# Patient Record
Sex: Male | Born: 1990 | Race: Black or African American | Hispanic: No | Marital: Married | Smoking: Never smoker
Health system: Southern US, Community
[De-identification: ages and names within clinical notes are randomized; demographics above are authoritative.]

---

## 2012-07-02 ENCOUNTER — Other Ambulatory Visit: Payer: Self-pay | Admitting: Family Medicine

## 2012-07-02 ENCOUNTER — Ambulatory Visit
Admission: RE | Admit: 2012-07-02 | Discharge: 2012-07-02 | Disposition: A | Discharge status: Home / Self Care | Payer: Worker's Compensation | Source: Ambulatory Visit | Attending: Family Medicine | Admitting: Family Medicine

## 2012-07-02 DIAGNOSIS — M79604 Pain in right leg: Secondary | ICD-10-CM

## 2013-04-27 ENCOUNTER — Emergency Department (HOSPITAL_COMMUNITY)
Admission: EM | Admit: 2013-04-27 | Discharge: 2013-04-28 | Disposition: A | Discharge status: Home / Self Care | Payer: No Typology Code available for payment source | Attending: Emergency Medicine | Admitting: Emergency Medicine

## 2013-04-27 DIAGNOSIS — S99929A Unspecified injury of unspecified foot, initial encounter: Secondary | ICD-10-CM | POA: Insufficient documentation

## 2013-04-27 DIAGNOSIS — Y92410 Unspecified street and highway as the place of occurrence of the external cause: Secondary | ICD-10-CM | POA: Insufficient documentation

## 2013-04-27 DIAGNOSIS — Y9389 Activity, other specified: Secondary | ICD-10-CM | POA: Insufficient documentation

## 2013-04-27 DIAGNOSIS — S8990XA Unspecified injury of unspecified lower leg, initial encounter: Secondary | ICD-10-CM | POA: Insufficient documentation

## 2013-04-27 DIAGNOSIS — M545 Low back pain: Secondary | ICD-10-CM

## 2013-04-27 DIAGNOSIS — IMO0002 Reserved for concepts with insufficient information to code with codable children: Secondary | ICD-10-CM | POA: Insufficient documentation

## 2013-04-28 ENCOUNTER — Encounter (HOSPITAL_COMMUNITY): Payer: Self-pay

## 2013-04-28 MED ORDER — CYCLOBENZAPRINE HCL 10 MG PO TABS
5.0000 mg | ORAL_TABLET | Freq: Once | ORAL | Status: AC
  Administered 2013-04-28: 5 mg via ORAL
  Filled 2013-04-28: qty 1

## 2013-04-28 MED ORDER — KETOROLAC TROMETHAMINE 30 MG/ML IJ SOLN
30.0000 mg | Freq: Once | INTRAMUSCULAR | Status: AC
  Administered 2013-04-28: 30 mg via INTRAMUSCULAR
  Filled 2013-04-28: qty 1

## 2013-04-28 MED ORDER — IBUPROFEN 600 MG PO TABS: 600.0000 mg | ORAL_TABLET | Freq: Four times a day (QID) | ORAL | Status: AC | PRN

## 2013-04-28 MED ORDER — CYCLOBENZAPRINE HCL 5 MG PO TABS: 5.0000 mg | ORAL_TABLET | Freq: Three times a day (TID) | ORAL | Status: AC | PRN
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
## 2013-04-28 NOTE — ED Provider Notes (Signed)
Medical screening examination/treatment/procedure(s) were performed by non-physician practitioner and as supervising physician I was immediately available for consultation/collaboration.  Orville Widmann M Lakashia Collison, MD 04/28/13 0447 

## 2016-11-04 ENCOUNTER — Emergency Department (HOSPITAL_COMMUNITY)
Admission: EM | Admit: 2016-11-04 | Discharge: 2016-11-04 | Disposition: A | Discharge status: Home / Self Care | Payer: Managed Care, Other (non HMO) | Attending: Emergency Medicine | Admitting: Emergency Medicine

## 2016-11-04 ENCOUNTER — Encounter (HOSPITAL_COMMUNITY): Payer: Self-pay

## 2016-11-04 ENCOUNTER — Emergency Department (HOSPITAL_COMMUNITY): Payer: Managed Care, Other (non HMO)

## 2016-11-04 DIAGNOSIS — Y929 Unspecified place or not applicable: Secondary | ICD-10-CM | POA: Insufficient documentation

## 2016-11-04 DIAGNOSIS — S0181XA Laceration without foreign body of other part of head, initial encounter: Secondary | ICD-10-CM | POA: Insufficient documentation

## 2016-11-04 DIAGNOSIS — W228XXA Striking against or struck by other objects, initial encounter: Secondary | ICD-10-CM | POA: Insufficient documentation

## 2016-11-04 DIAGNOSIS — S0990XA Unspecified injury of head, initial encounter: Secondary | ICD-10-CM | POA: Diagnosis not present

## 2016-11-04 DIAGNOSIS — Y939 Activity, unspecified: Secondary | ICD-10-CM | POA: Insufficient documentation

## 2016-11-04 DIAGNOSIS — Z79899 Other long term (current) drug therapy: Secondary | ICD-10-CM | POA: Diagnosis not present

## 2016-11-04 DIAGNOSIS — S0992XA Unspecified injury of nose, initial encounter: Secondary | ICD-10-CM | POA: Diagnosis present

## 2016-11-04 DIAGNOSIS — S022XXA Fracture of nasal bones, initial encounter for closed fracture: Secondary | ICD-10-CM | POA: Diagnosis not present

## 2016-11-04 DIAGNOSIS — Y999 Unspecified external cause status: Secondary | ICD-10-CM | POA: Insufficient documentation

## 2016-11-04 MED ORDER — OXYCODONE-ACETAMINOPHEN 5-325 MG PO TABS
1.0000 | ORAL_TABLET | Freq: Once | ORAL | Status: AC
  Administered 2016-11-04: 1 via ORAL
  Filled 2016-11-04: qty 1

## 2016-11-04 MED ORDER — LIDOCAINE-EPINEPHRINE (PF) 2 %-1:200000 IJ SOLN
10.0000 mL | Freq: Once | INTRAMUSCULAR | Status: AC
  Administered 2016-11-04: 10 mL
  Filled 2016-11-04: qty 20

## 2016-11-04 MED ORDER — CEPHALEXIN 250 MG PO CAPS
500.0000 mg | ORAL_CAPSULE | Freq: Once | ORAL | Status: AC
  Administered 2016-11-04: 500 mg via ORAL
  Filled 2016-11-04: qty 2

## 2016-11-04 MED ORDER — CEPHALEXIN 500 MG PO CAPS: 500.0000 mg | ORAL_CAPSULE | Freq: Two times a day (BID) | ORAL | 0 refills | Status: DC

## 2016-11-04 MED ORDER — ACETAMINOPHEN 500 MG PO TABS
1000.0000 mg | ORAL_TABLET | Freq: Once | ORAL | Status: AC
  Administered 2016-11-04: 1000 mg via ORAL
  Filled 2016-11-04: qty 2

## 2016-11-04 MED ORDER — LIDOCAINE-EPINEPHRINE-TETRACAINE (LET) SOLUTION
3.0000 mL | Freq: Once | NASAL | Status: AC
  Administered 2016-11-04: 16:00:00 3 mL via TOPICAL
  Filled 2016-11-04: qty 3

## 2016-11-04 MED ORDER — CEPHALEXIN 500 MG PO CAPS: 500.0000 mg | ORAL_CAPSULE | Freq: Three times a day (TID) | ORAL | 0 refills | Status: AC

## 2016-11-04 NOTE — Discharge Instructions (Signed)
Mr. Billy Black   Please take start taking your antibiotic, Keflex twice a day for 4 days [facility] to follow up your nasal fracture You will need to remove your sutures in 7 days.  You can return to the ED, an urgent care or ENT follow up visit to do so

## 2016-11-04 NOTE — ED Notes (Signed)
Pt verbalized understanding discharge instructions and denies any further needs or questions at this time. VS stable, ambulatory and steady gait.   

## 2016-11-04 NOTE — ED Notes (Signed)
[facility] [place] working [facility].  Tech services called.  This RN checked pt's armband and asked pt to repeat bday and name before applying LET.

## 2016-11-04 NOTE — ED Triage Notes (Signed)
Pt was at the trampoline park, did a flip into the foam pit and hit face into a wood 2x2 railing inside the foam pit.  Lac to forehead, across bridge of nose.  Abrasion to right outer eyebrow.  Inside upper lip blood noted.  Pt thinks he lost consciousness for few seconds. Bleeding controlled.

## 2016-11-04 NOTE — ED Provider Notes (Addendum)
[facility]PT Provider Note   CSN: [id number] Arrival date & time: 11/04/16  1241     History   Chief Complaint Chief Complaint  Patient presents with  . Loss of Consciousness  . Laceration    HPI Billy Black is a 26 y.o. male. That presents to the ED after hitting his head and nose on a wooden bar.  The patient was at an indoor foam pit activity area when he did a flip off of a trampoline and traveled for over 30 feet before landing at the edge of the foam pit where he hit is head and nose on a wooden bar.  His girlfriend witnessed the event and stated that the patient lost consciousness for about 10 seconds but when regained consciousness was at his baseline mental status.  He reports associated symptoms of lethargy and headache.  Patient denies nausea/vomiting, vision changes or confusion.   HPI  History reviewed. No pertinent past medical history.  There are no active problems to display for this patient.   History reviewed. No pertinent surgical history.     Home Medications    Prior to Admission medications   Medication Sig Start Date End Date Taking? Authorizing Provider  cephALEXin (KEFLEX) 500 MG capsule Take 1 capsule (500 mg total) by mouth 3 (three) times daily. 11/04/16   Eber HongBrian Miller, MD  cyclobenzaprine (FLEXERIL) 5 MG tablet Take 1 tablet (5 mg total) by mouth 3 (three) times daily as needed for muscle spasms. Patient not taking: Reported on 11/04/2016 04/28/13   Earley FavorGail Schulz, NP  ibuprofen (ADVIL,MOTRIN) 600 MG tablet Take 1 tablet (600 mg total) by mouth every 6 (six) hours as needed for pain. Patient not taking: Reported on 11/04/2016 04/28/13   Earley FavorGail Schulz, NP    Family History History reviewed. No pertinent family history.  Social History Social History  Substance Use Topics  . Smoking status: Never Smoker  . Smokeless tobacco: Never Used  . Alcohol use No     Allergies   Patient has no known allergies.   Review of Systems Review of  Systems  HENT: Positive for nosebleeds.   Eyes: Negative for visual disturbance.  Musculoskeletal: Negative for back pain and neck pain.  Neurological: Positive for headaches. Negative for dizziness, weakness and numbness.  Psychiatric/Behavioral: Negative for confusion.     Physical Exam Updated Vital Signs BP 147/82   Pulse 69   Temp 98.2 F (36.8 C) (Oral)   Resp 18   SpO2 100%   Physical Exam  HENT:  Head: Normocephalic and atraumatic.  approx 2 in laceration on forehead  2cm laceration near bridge of nose  Eyes: EOM are normal. Pupils are equal, round, and reactive to light.  Neck: Normal range of motion.  Cardiovascular: Normal rate, regular rhythm and normal heart sounds.  Exam reveals no gallop and no friction rub.   No murmur heard. Pulmonary/Chest: Effort normal and breath sounds normal. No respiratory distress. He has no wheezes. He has no rales.  Musculoskeletal:  No tenderness on cervical, thoracic or lumbar spine      ED Treatments / Results  Labs (all labs ordered are listed, but only abnormal results are displayed) Labs Reviewed - No data to display  EKG  EKG Interpretation None       Radiology Ct Head Wo Contrast  Result Date: 11/04/2016 CLINICAL DATA:  Patient was jumping on a trampoline and fell forward and hit his forehead and bridge of his nose. "Slight headache with some  nausea, +LOC" per patient Abrasion on forehead and bridge of nose EXAM: CT HEAD WITHOUT CONTRAST TECHNIQUE: Contiguous axial images were obtained from the base of the skull through the vertex without intravenous contrast. COMPARISON:  None. FINDINGS: Brain: No evidence of acute infarction, hemorrhage, hydrocephalus, extra-axial collection or mass lesion/mass effect. Vascular: No hyperdense vessel or unexpected calcification. Skull: Left frontal scalp edema is present. No underlying calvarial fracture. Sinuses/Orbits: Paranasal sinuses are normally aerated. No mastoid effusion.  Orbits are normal in appearance. Other: There is a comminuted fracture of the nasal bones associated with soft tissue swelling and laceration of the nose. The nose is not completely imaged. IMPRESSION: 1.  No evidence for acute intracranial abnormality. 2. Comminuted nasal bone fractures and associated laceration and edema of the nose. 3. Frontal scalp edema without underlying calvarial fracture. Electronically Signed   By: Norva Pavlov M.D.   On: 11/04/2016 16:00    Procedures .Marland KitchenLaceration Repair Date/Time: 11/04/2016 2:00 PM Performed by: Geralyn Corwin RATLIFF Authorized by: Cathren Laine   Consent:    Consent obtained:  Verbal   Consent given by:  Patient   Risks discussed:  Infection, pain and poor cosmetic result Anesthesia (see MAR for exact dosages):    Anesthesia method:  Local infiltration   Local anesthetic:  Lidocaine 2% WITH epi Laceration details:    Location:  Face   Face location:  Forehead   Length (cm):  3   Depth (mm):  0.3 Repair type:    Repair type:  Simple Pre-procedure details:    Preparation:  Imaging obtained to evaluate for foreign bodies and patient was prepped and draped in usual sterile fashion Exploration:    Hemostasis achieved with:  LET   Contaminated: no   Treatment:    Area cleansed with:  Betadine   Amount of cleaning:  Standard   Irrigation solution:  Sterile saline   Irrigation method:  Syringe Skin repair:    Repair method:  Sutures   Suture size:  6-0   Suture material:  Nylon   Suture technique:  Simple interrupted   Number of sutures:  7 Approximation:    Approximation:  Close   Vermilion border: well-aligned   Post-procedure details:    Dressing:  Non-adherent dressing   Patient tolerance of procedure:  Tolerated well, no immediate complications Comments:     Also did a lac repair to the nose, nasal bridge.  Same preparation as above except did 3 sutures to the area.     (including critical care time)  Medications  Ordered in ED Medications  lidocaine-EPINEPHrine-tetracaine (LET) solution (3 mLs Topical Given 11/04/16 1547)  lidocaine-EPINEPHrine (XYLOCAINE W/EPI) 2 %-1:200000 (PF) injection 10 mL (10 mLs Infiltration Given 11/04/16 1700)  cephALEXin (KEFLEX) capsule 500 mg (500 mg Oral Given 11/04/16 1810)  acetaminophen (TYLENOL) tablet 1,000 mg (1,000 mg Oral Given 11/04/16 1810)  oxyCODONE-acetaminophen (PERCOCET/ROXICET) 5-325 MG per tablet 1 tablet (1 tablet Oral Given 11/04/16 1811)     Initial Impression / Assessment and Plan / ED Course  I have reviewed the triage vital signs and the nursing notes.  Pertinent labs & imaging results that were available during my care of the patient were reviewed by me and considered in my medical decision making (see chart for details).   CT head showed commuted fracture of the nasal bones.  Patient had a laceration repair to his forehead and nose. Patient was instructed to return to the ED for suture removal in 7 days. Patient states he had  a tdap vaccine one month ago and declined having another one at this visit.  He was instructed on following up with [facility] for his nasal fracture and to start taking Keflex after discharge.       Final Clinical Impressions(s) / ED Diagnoses   Final diagnoses:  Head injury due to trauma  Facial laceration, initial encounter  Closed fracture of nasal bone, initial encounter    New Prescriptions Discharge Medication List as of 11/04/2016  5:30 PM    START taking these medications   Details  cephALEXin (KEFLEX) 500 MG capsule Take 1 capsule (500 mg total) by mouth 2 (two) times daily., Starting Sat 11/04/2016, Normal         Bary Richard Dawson, DO 11/05/16 2050    Cathren Laine, MD 11/06/16 2355    Bary Richard [place], DO 11/27/16 2200    [address] [place], DO 11/27/16 2202    Cathren Laine, MD 11/30/16 1022

## 2018-06-13 IMAGING — CT CT HEAD W/O CM
4 series · 18 of 47 positions shown, 20 images · non-contrast
Comparison: None.

CLINICAL DATA: Patient was jumping on a trampoline and fell forward
and hit his forehead and bridge of his nose. "Slight headache with
some nausea, +LOC" per patient Abrasion on forehead and bridge of
nose

EXAM:
CT HEAD WITHOUT CONTRAST
TECHNIQUE: Contiguous axial images were obtained from the base of the skull
through the vertex without intravenous contrast.

[Series 201: head w/o, idose (1) · axial · non-contrast · 0.43mm/px · z∈[+114,+234]mm · 8 of 32 slices shown, 10 images]
[im 4/32  brain]
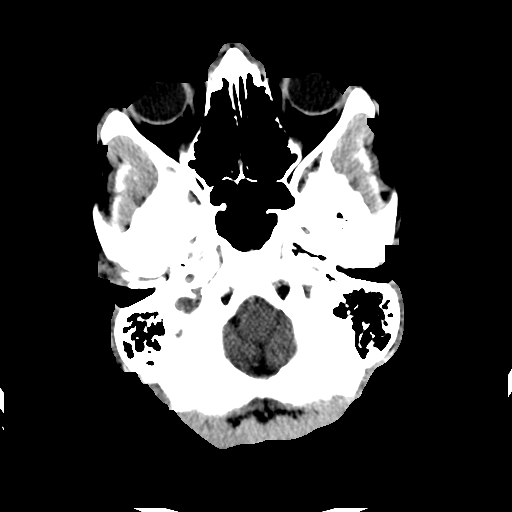
[im 4/32  bone]
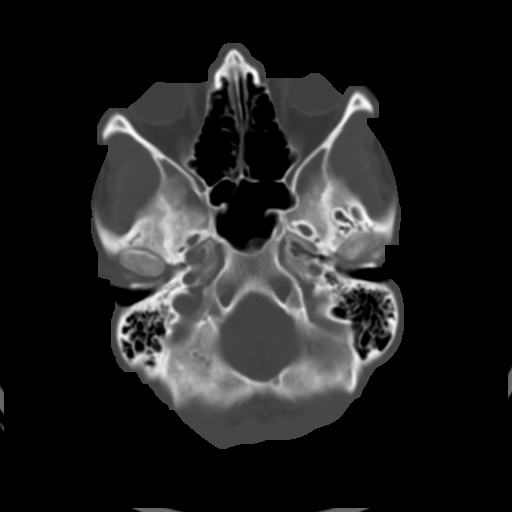
[im 7/32  brain]
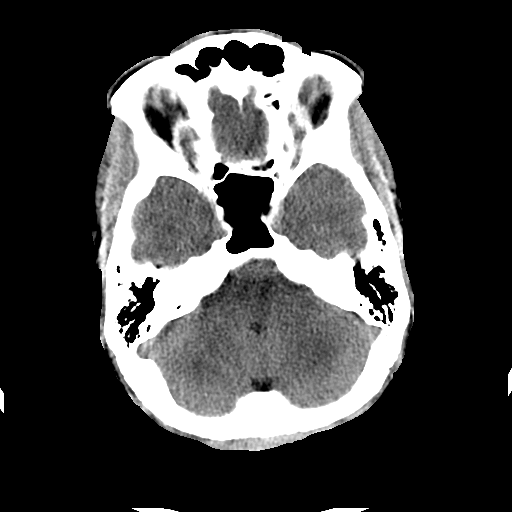
[im 11/32  brain]
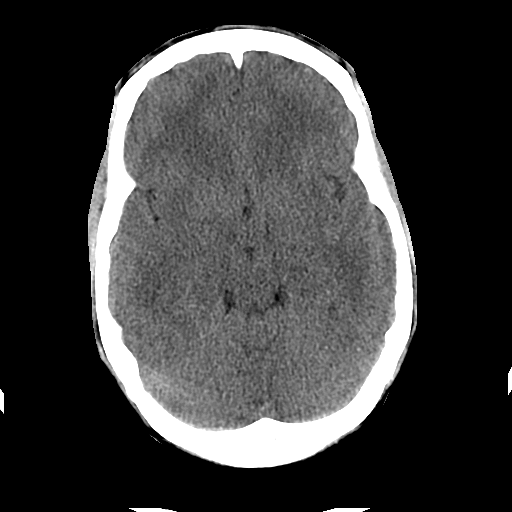
[im 14/32  brain]
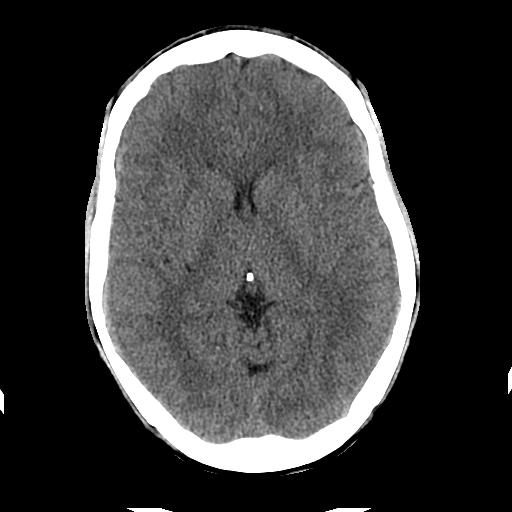
[im 18/32  brain]
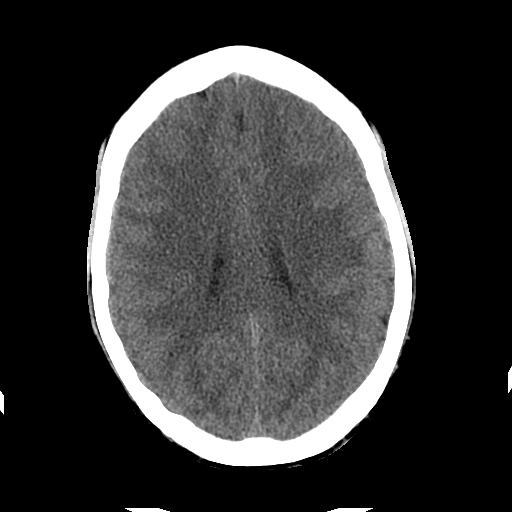
[im 18/32  bone]
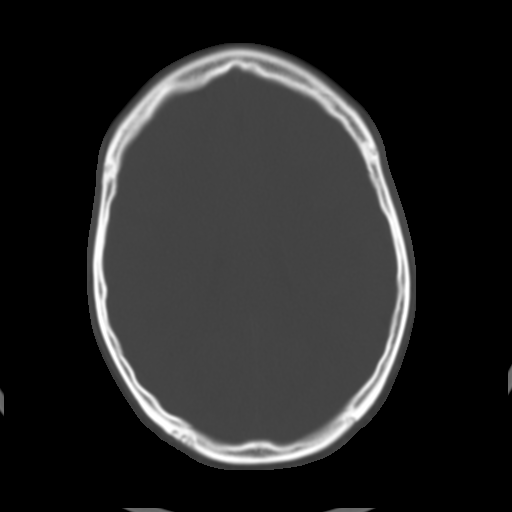
[im 21/32  brain]
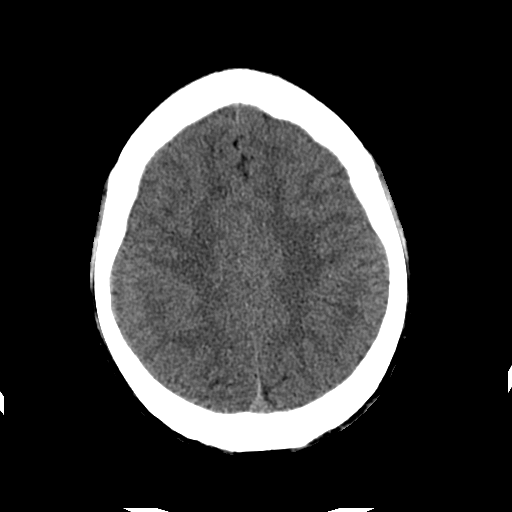
[im 25/32  brain]
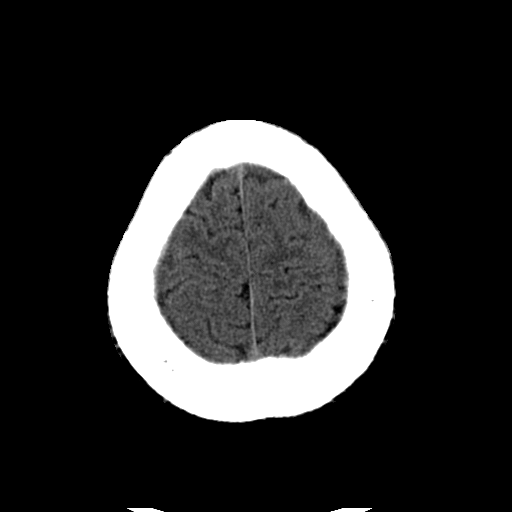
[im 28/32  brain]
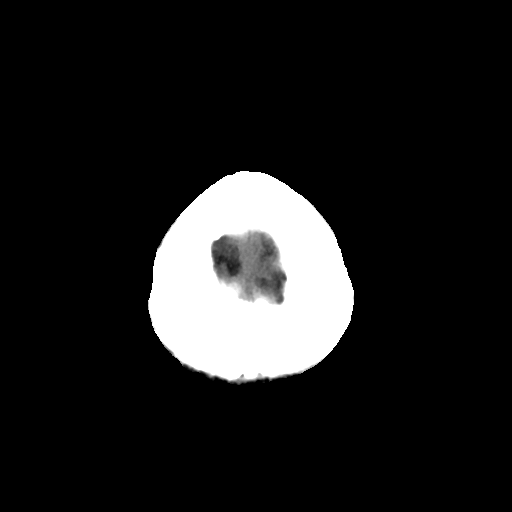

[Series 202: head w/o bone, idose (1) · axial · non-contrast · 0.43mm/px · z∈[+112,+162]mm · 4 of 64 slices shown]
[im 7/64  bone]
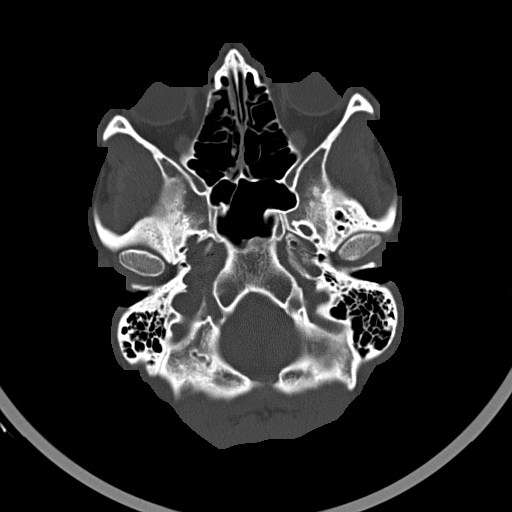
[im 14/64  bone]
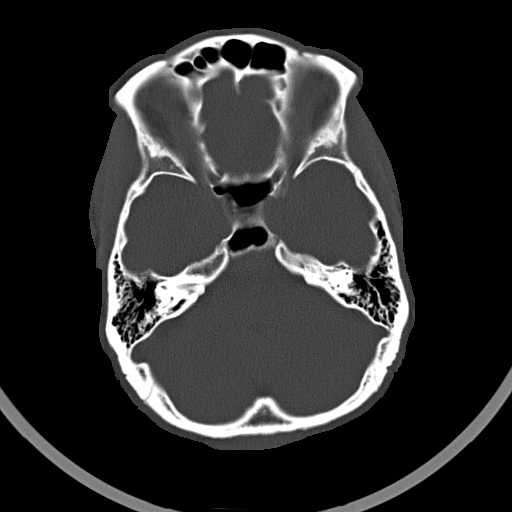
[im 20/64  bone]
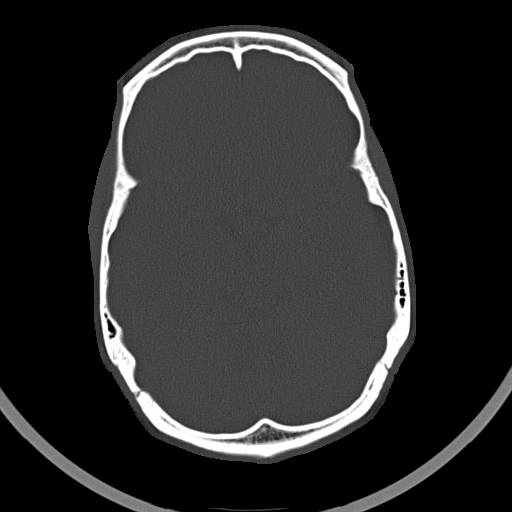
[im 27/64  bone]
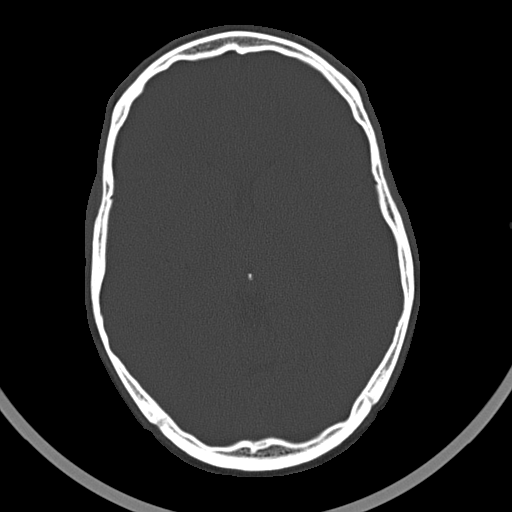

[Series 203: coronal st, idose (1) · coronal · 0.40mm/px · 3 of 72 slices shown]
[im 24/72  brain]
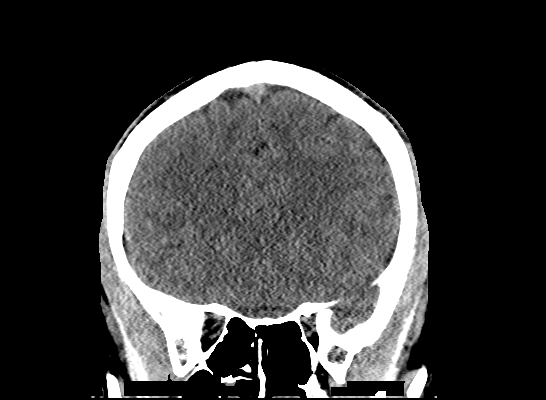
[im 32/72  brain]
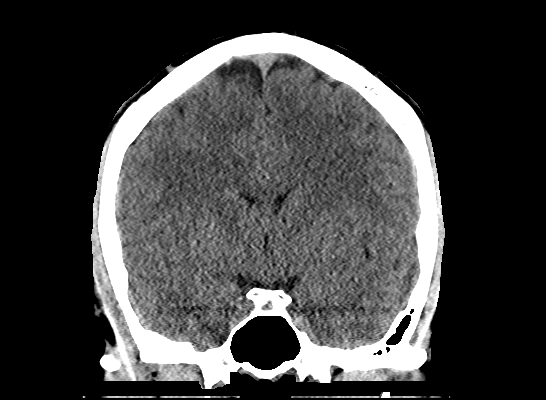
[im 40/72  brain]
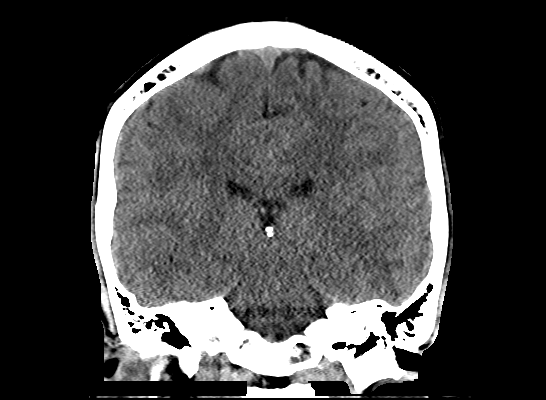

[Series 204: sagittal st, idose (1) · sagittal · 0.40mm/px · 3 of 73 slices shown]
[im 25/73  brain]
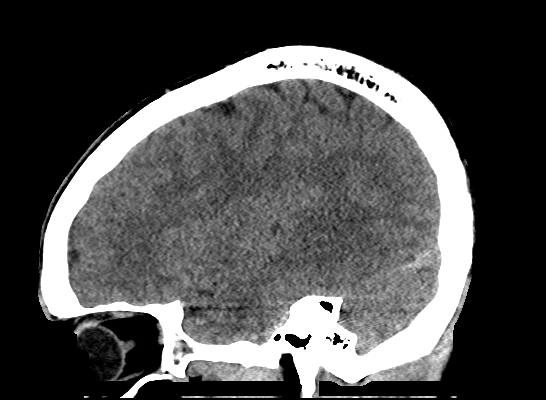
[im 37/73  brain]
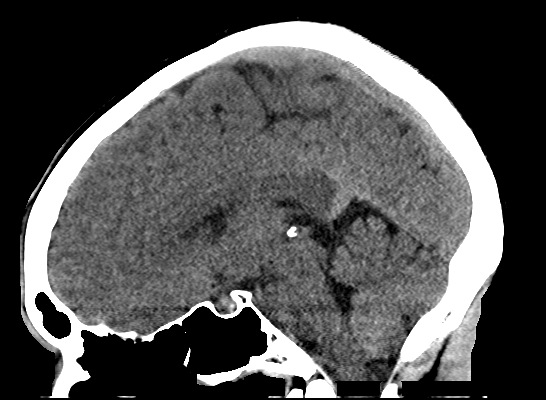
[im 49/73  brain]
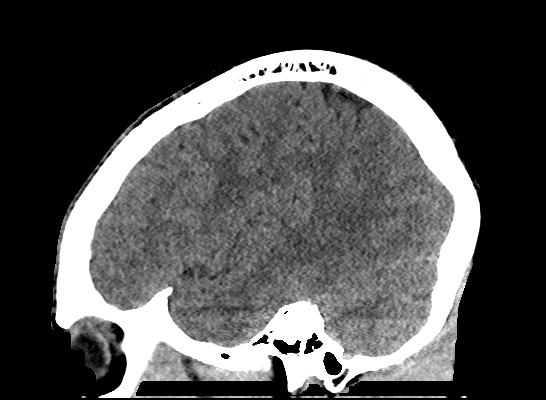

[18 of 47 positions shown; findings below may reference images not displayed]

FINDINGS: Brain: No evidence of acute infarction, hemorrhage, hydrocephalus,
extra-axial collection or mass lesion/mass effect.

Vascular: No hyperdense vessel or unexpected calcification.

Skull: Left frontal scalp edema is present. No underlying calvarial
fracture.

Sinuses/Orbits: Paranasal sinuses are normally aerated. No mastoid
effusion. Orbits are normal in appearance.

Other: There is a comminuted fracture of the nasal bones associated
with soft tissue swelling and laceration of the nose. The nose is
not completely imaged.
IMPRESSION: 1.  No evidence for acute intracranial abnormality.
2. Comminuted nasal bone fractures and associated laceration and
edema of the nose.
3. Frontal scalp edema without underlying calvarial fracture.

## 2019-07-15 ENCOUNTER — Other Ambulatory Visit: Payer: Self-pay

## 2019-07-15 DIAGNOSIS — Z20822 Contact with and (suspected) exposure to covid-19: Secondary | ICD-10-CM

## 2019-07-17 LAB — NOVEL CORONAVIRUS, NAA: SARS-CoV-2, NAA: NOT DETECTED

## 2023-01-05 ENCOUNTER — Other Ambulatory Visit: Payer: Self-pay

## 2023-01-05 ENCOUNTER — Encounter (HOSPITAL_BASED_OUTPATIENT_CLINIC_OR_DEPARTMENT_OTHER): Payer: Self-pay | Admitting: Emergency Medicine

## 2023-01-05 ENCOUNTER — Emergency Department (HOSPITAL_BASED_OUTPATIENT_CLINIC_OR_DEPARTMENT_OTHER)
Admission: EM | Admit: 2023-01-05 | Discharge: 2023-01-05 | Disposition: A | Discharge status: Home / Self Care | Payer: Commercial Managed Care - PPO | Attending: Emergency Medicine | Admitting: Emergency Medicine

## 2023-01-05 DIAGNOSIS — L0291 Cutaneous abscess, unspecified: Secondary | ICD-10-CM

## 2023-01-05 DIAGNOSIS — M549 Dorsalgia, unspecified: Secondary | ICD-10-CM | POA: Diagnosis not present

## 2023-01-05 DIAGNOSIS — L02212 Cutaneous abscess of back [any part, except buttock]: Secondary | ICD-10-CM | POA: Diagnosis not present

## 2023-01-05 MED ORDER — LIDOCAINE-EPINEPHRINE 2 %-1:200000 IJ SOLN
10.0000 mL | Freq: Once | INTRAMUSCULAR | Status: AC
Start: 2023-01-05 — End: 2023-01-05
  Administered 2023-01-05: 10 mL via INTRADERMAL
  Filled 2023-01-05: qty 20

## 2023-01-05 NOTE — ED Triage Notes (Signed)
States may have a infected pimple on back x 4 days.

## 2023-01-05 NOTE — ED Provider Notes (Signed)
[facility] Provider Note   CSN: [id number] Arrival date & time: 01/05/23  2119     History  Chief Complaint  Patient presents with   Abscess    Billy Black is a 32 y.o. male.  32 yo M with a chief complaints of a area of erythema and discomfort to his back.  Going on for about 4 days now.  No fevers.  No trauma.   Abscess      Home Medications Prior to Admission medications   Medication Sig Start Date End Date Taking? Authorizing Provider  cephALEXin (KEFLEX) 500 MG capsule Take 1 capsule (500 mg total) by mouth 3 (three) times daily. 11/04/16   Eber Hong, MD  cyclobenzaprine (FLEXERIL) 5 MG tablet Take 1 tablet (5 mg total) by mouth 3 (three) times daily as needed for muscle spasms. Patient not taking: Reported on 11/04/2016 04/28/13   Earley Favor, NP  ibuprofen (ADVIL,MOTRIN) 600 MG tablet Take 1 tablet (600 mg total) by mouth every 6 (six) hours as needed for pain. Patient not taking: Reported on 11/04/2016 04/28/13   Earley Favor, NP      Allergies    Patient has no known allergies.    Review of Systems   Review of Systems  Physical Exam Updated Vital Signs BP 112/69 (BP Location: Left Arm)   Pulse (!) 59   Temp 98.3 F (36.8 C) (Oral)   Resp 18   Ht 6\' 1"  (1.854 m)   Wt 92.5 kg   SpO2 100%   BMI 26.91 kg/m  Physical Exam Vitals and nursing note reviewed.  Constitutional:      Appearance: He is well-developed.  HENT:     Head: Normocephalic and atraumatic.  Eyes:     Pupils: Pupils are equal, round, and reactive to light.  Neck:     Vascular: No JVD.  Cardiovascular:     Rate and Rhythm: Normal rate and regular rhythm.     Heart sounds: No murmur heard.    No friction rub. No gallop.  Pulmonary:     Effort: No respiratory distress.     Breath sounds: No wheezing.  Abdominal:     General: There is no distension.     Tenderness: There is no abdominal tenderness. There is no guarding or rebound.   Musculoskeletal:        General: Normal range of motion.     Cervical back: Normal range of motion and neck supple.     Comments: Just left of midline has a approximately quarter sized area of fluctuance without any obvious surrounding induration.  Erythematous.  Skin:    Coloration: Skin is not pale.     Findings: No rash.  Neurological:     Mental Status: He is alert and oriented to person, place, and time.  Psychiatric:        Behavior: Behavior normal.     ED Results / Procedures / Treatments   Labs (all labs ordered are listed, but only abnormal results are displayed) Labs Reviewed - No data to display  EKG None  Radiology No results found.  Procedures .Marland KitchenIncision and Drainage  Date/Time: 01/05/2023 9:34 AM  Performed by: Melene Plan, DO Authorized by: Melene Plan, DO   Consent:    Consent obtained:  Verbal   Consent given by:  Patient   Risks, benefits, and alternatives were discussed: yes     Risks discussed:  Bleeding, incomplete drainage and infection  Alternatives discussed:  No treatment, delayed treatment and alternative treatment Universal protocol:    Procedure explained and questions answered to patient or proxy's satisfaction: yes     Immediately prior to procedure, a time out was called: yes     Patient identity confirmed:  Verbally with patient Location:    Type:  Abscess   Location:  Trunk   Trunk location:  Back Pre-procedure details:    Skin preparation:  Chlorhexidine Sedation:    Sedation type:  None Anesthesia:    Anesthesia method:  Local infiltration   Local anesthetic:  Lidocaine 2% WITH epi Procedure type:    Complexity:  Complex Procedure details:    Incision types:  Single straight   Incision depth:  Subcutaneous   Wound management:  Probed and deloculated   Drainage:  Purulent   Drainage amount:  Copious   Wound treatment:  Wound left open   Packing materials:  None Post-procedure details:    Procedure completion:  Tolerated  well, no immediate complications     Medications Ordered in ED Medications  lidocaine-EPINEPHrine (XYLOCAINE W/EPI) 2 %-1:200000 (PF) injection 10 mL (10 mLs Intradermal Given by Other 01/05/23 [id number])    ED Course/ Medical Decision Making/ A&P                             Medical Decision Making Risk Prescription drug management.   32 yo M with a chief complaints of an abscess.  Will I&D at bedside.  Incision and drainage performed without issue.  Will discharge home.  PCP follow-up.  9:35 AM:  I have discussed the diagnosis/risks/treatment options with the patient.  Evaluation and diagnostic testing in the emergency department does not suggest an emergent condition requiring admission or immediate intervention beyond what has been performed at this time.  They will follow up with PCP. We also discussed returning to the ED immediately if new or worsening sx occur. We discussed the sx which are most concerning (e.g., sudden worsening pain, fever, inability to tolerate by mouth) that necessitate immediate return. Medications administered to the patient during their visit and any new prescriptions provided to the patient are listed below.  Medications given during this visit Medications  lidocaine-EPINEPHrine (XYLOCAINE W/EPI) 2 %-1:200000 (PF) injection 10 mL (10 mLs Intradermal Given by Other 01/05/23 0914)     The patient appears reasonably screen and/or stabilized for discharge and I doubt any other medical condition or other [facility] requiring further screening, evaluation, or treatment in the ED at this time prior to discharge.          Final Clinical Impression(s) / ED Diagnoses Final diagnoses:  Abscess    Rx / DC Orders ED Discharge Orders     None         Melene PlanFloyd, Brailon Don, DO 01/05/23 [id number]

## 2023-01-05 NOTE — Discharge Instructions (Addendum)
Your abscess should not affect your ability to serve in the [employer].  Please apply warm compress to this at least 4 times a day.  Please return for rapid spreading redness or if you develop a fever.  Follow-up with your doctor in the office.  The textbook suggest that you come back in 48 hours to have it rechecked.  If you are doing well then you can wait and have it followed up by your doctor.

## 2023-03-30 ENCOUNTER — Ambulatory Visit (INDEPENDENT_AMBULATORY_CARE_PROVIDER_SITE_OTHER): Payer: Commercial Managed Care - PPO | Admitting: Podiatry

## 2023-03-30 ENCOUNTER — Encounter: Payer: Self-pay | Admitting: Podiatry

## 2023-03-30 DIAGNOSIS — L600 Ingrowing nail: Secondary | ICD-10-CM | POA: Diagnosis not present

## 2023-03-30 DIAGNOSIS — B351 Tinea unguium: Secondary | ICD-10-CM

## 2023-03-30 NOTE — Progress Notes (Signed)
Subjective:   Patient ID: Billy Black, male   DOB: 32 y.o.   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  States the other nails do bother him also patient is not a diabetic and has not had treatment for this.  Patient does not smoke likes to be active   Review of Systems  All other systems reviewed and are negative.       Objective:  Physical Exam Vitals and nursing note reviewed.  Constitutional:      Appearance: He is well-developed.  Pulmonary:     Effort: Pulmonary effort is normal.  Musculoskeletal:        General: Normal range of motion.  Skin:    General: Skin is warm.  Neurological:     Mental Status: He is alert.     Neurovascular status intact muscle strength found to be adequate range of motion adequate with severely deformed third and fourth nails bilateral dystrophic with incurvated medial borders hallux bilateral painful when pressed.  Patient has good digital perfusion well-oriented x 3     Assessment:  Damage nailbeds which I think is more trauma versus fungus with pain associated with the structure of the nails     Plan:  H&P reviewed fungus versus trauma and I do think given the structure of these nailbeds it would probably be best for nail removal and I explained that to the patient going over what would be required.  Patient wants to get this taken care of we are can get him information concerning that he will schedule for nail removal and removal of ingrown toenail border with all questions answered today.  If he decides someday to do oral antifungal we could try but I do not have high expectations
